# Patient Record
Sex: Male | Born: 1935 | Race: White | Hispanic: No | Marital: Single
Health system: Southern US, Community
[De-identification: ages and names within clinical notes are randomized; demographics above are authoritative.]

## PROBLEM LIST (undated history)

## (undated) DIAGNOSIS — A419 Sepsis, unspecified organism: Secondary | ICD-10-CM

## (undated) DIAGNOSIS — G3183 Dementia with Lewy bodies: Secondary | ICD-10-CM

## (undated) DIAGNOSIS — C649 Malignant neoplasm of unspecified kidney, except renal pelvis: Secondary | ICD-10-CM

## (undated) DIAGNOSIS — R260 Ataxic gait: Secondary | ICD-10-CM

## (undated) DIAGNOSIS — N200 Calculus of kidney: Secondary | ICD-10-CM

## (undated) DIAGNOSIS — G4700 Insomnia, unspecified: Secondary | ICD-10-CM

## (undated) DIAGNOSIS — G20A1 Parkinson's disease without dyskinesia, without mention of fluctuations: Secondary | ICD-10-CM

## (undated) DIAGNOSIS — F32A Depression, unspecified: Secondary | ICD-10-CM

## (undated) DIAGNOSIS — R531 Weakness: Secondary | ICD-10-CM

## (undated) DIAGNOSIS — R1310 Dysphagia, unspecified: Secondary | ICD-10-CM

## (undated) DIAGNOSIS — R269 Unspecified abnormalities of gait and mobility: Secondary | ICD-10-CM

## (undated) DIAGNOSIS — G20 Parkinson's disease: Secondary | ICD-10-CM

## (undated) DIAGNOSIS — F329 Major depressive disorder, single episode, unspecified: Secondary | ICD-10-CM

## (undated) DIAGNOSIS — G309 Alzheimer's disease, unspecified: Secondary | ICD-10-CM

## (undated) DIAGNOSIS — F0280 Dementia in other diseases classified elsewhere without behavioral disturbance: Secondary | ICD-10-CM

---

## 2014-02-19 ENCOUNTER — Emergency Department (HOSPITAL_BASED_OUTPATIENT_CLINIC_OR_DEPARTMENT_OTHER): Payer: Medicare HMO

## 2014-02-19 ENCOUNTER — Emergency Department (HOSPITAL_BASED_OUTPATIENT_CLINIC_OR_DEPARTMENT_OTHER)
Admission: EM | Admit: 2014-02-19 | Discharge: 2014-02-19 | Disposition: A | Discharge status: Home / Self Care | Payer: Medicare HMO | Attending: Emergency Medicine | Admitting: Emergency Medicine

## 2014-02-19 ENCOUNTER — Encounter (HOSPITAL_BASED_OUTPATIENT_CLINIC_OR_DEPARTMENT_OTHER): Payer: Self-pay

## 2014-02-19 DIAGNOSIS — F0280 Dementia in other diseases classified elsewhere without behavioral disturbance: Secondary | ICD-10-CM | POA: Diagnosis not present

## 2014-02-19 DIAGNOSIS — G20 Parkinson's disease: Secondary | ICD-10-CM | POA: Diagnosis not present

## 2014-02-19 DIAGNOSIS — Y998 Other external cause status: Secondary | ICD-10-CM | POA: Diagnosis not present

## 2014-02-19 DIAGNOSIS — Y9389 Activity, other specified: Secondary | ICD-10-CM | POA: Insufficient documentation

## 2014-02-19 DIAGNOSIS — G309 Alzheimer's disease, unspecified: Secondary | ICD-10-CM | POA: Insufficient documentation

## 2014-02-19 DIAGNOSIS — F329 Major depressive disorder, single episode, unspecified: Secondary | ICD-10-CM | POA: Insufficient documentation

## 2014-02-19 DIAGNOSIS — Z23 Encounter for immunization: Secondary | ICD-10-CM | POA: Insufficient documentation

## 2014-02-19 DIAGNOSIS — Y92012 Bathroom of single-family (private) house as the place of occurrence of the external cause: Secondary | ICD-10-CM | POA: Insufficient documentation

## 2014-02-19 DIAGNOSIS — Z85528 Personal history of other malignant neoplasm of kidney: Secondary | ICD-10-CM | POA: Diagnosis not present

## 2014-02-19 DIAGNOSIS — Z87442 Personal history of urinary calculi: Secondary | ICD-10-CM | POA: Diagnosis not present

## 2014-02-19 DIAGNOSIS — Z8619 Personal history of other infectious and parasitic diseases: Secondary | ICD-10-CM | POA: Diagnosis not present

## 2014-02-19 DIAGNOSIS — S50812A Abrasion of left forearm, initial encounter: Secondary | ICD-10-CM | POA: Diagnosis not present

## 2014-02-19 DIAGNOSIS — W19XXXA Unspecified fall, initial encounter: Secondary | ICD-10-CM | POA: Insufficient documentation

## 2014-02-19 DIAGNOSIS — S59912A Unspecified injury of left forearm, initial encounter: Secondary | ICD-10-CM | POA: Diagnosis present

## 2014-02-19 DIAGNOSIS — Z79899 Other long term (current) drug therapy: Secondary | ICD-10-CM | POA: Insufficient documentation

## 2014-02-19 HISTORY — DX: Dementia in other diseases classified elsewhere, unspecified severity, without behavioral disturbance, psychotic disturbance, mood disturbance, and anxiety: F02.80

## 2014-02-19 HISTORY — DX: Unspecified abnormalities of gait and mobility: R26.9

## 2014-02-19 HISTORY — DX: Dysphagia, unspecified: R13.10

## 2014-02-19 HISTORY — DX: Sepsis, unspecified organism: A41.9

## 2014-02-19 HISTORY — DX: Alzheimer's disease, unspecified: G30.9

## 2014-02-19 HISTORY — DX: Ataxic gait: R26.0

## 2014-02-19 HISTORY — DX: Major depressive disorder, single episode, unspecified: F32.9

## 2014-02-19 HISTORY — DX: Calculus of kidney: N20.0

## 2014-02-19 HISTORY — DX: Parkinson's disease: G20

## 2014-02-19 HISTORY — DX: Dementia with Lewy bodies: G31.83

## 2014-02-19 HISTORY — DX: Malignant neoplasm of unspecified kidney, except renal pelvis: C64.9

## 2014-02-19 HISTORY — DX: Insomnia, unspecified: G47.00

## 2014-02-19 HISTORY — DX: Depression, unspecified: F32.A

## 2014-02-19 HISTORY — DX: Parkinson's disease without dyskinesia, without mention of fluctuations: G20.A1

## 2014-02-19 HISTORY — DX: Weakness: R53.1

## 2014-02-19 LAB — URINALYSIS, ROUTINE W REFLEX MICROSCOPIC
Bilirubin Urine: NEGATIVE
Glucose, UA: NEGATIVE mg/dL
Hgb urine dipstick: NEGATIVE
KETONES UR: 15 mg/dL — AB
LEUKOCYTES UA: NEGATIVE
NITRITE: NEGATIVE
PROTEIN: NEGATIVE mg/dL
Specific Gravity, Urine: 1.022 (ref 1.005–1.030)
UROBILINOGEN UA: 0.2 mg/dL (ref 0.0–1.0)
pH: 6.5 (ref 5.0–8.0)

## 2014-02-19 MED ORDER — TETANUS-DIPHTH-ACELL PERTUSSIS 5-2.5-18.5 LF-MCG/0.5 IM SUSP
0.5000 mL | Freq: Once | INTRAMUSCULAR | Status: AC
  Administered 2014-02-19: 0.5 mL via INTRAMUSCULAR
  Filled 2014-02-19: qty 0.5

## 2014-02-19 MED ORDER — ACETAMINOPHEN 325 MG PO TABS
650.0000 mg | ORAL_TABLET | Freq: Once | ORAL | Status: AC
  Administered 2014-02-19: 650 mg via ORAL
  Filled 2014-02-19: qty 2

## 2014-02-19 NOTE — ED Notes (Signed)
[facility] here for transport. 

## 2014-02-19 NOTE — ED Notes (Signed)
Per ems patient had unwitnessed fall in bathroom at SN[facility]).  Complains of thumb pain and back pain

## 2014-02-19 NOTE — Discharge Instructions (Signed)
The patient was evaluated in the emergency department. We got a CT of his head and an x-ray of his left hand. EMS reported that he had been complaining of left thumb pain. There was a questionable lucency on the x-ray but on my repeat examination he had no pain in the thumb so it was not splinted. The lucency is thought to be a vascular channel and less likely that it is actually a fracture. If he does develop pain in the left thumb it would not be unreasonable to buddy tape the thumb or splint it. The CT of his head was unremarkable and his urinalysis was unremarkable as well.

## 2014-02-19 NOTE — ED Provider Notes (Signed)
CSN: [id number]     Arrival date & time 02/19/14  0945 History   First MD Initiated Contact with Patient 02/19/14 [phone]     Chief Complaint  Patient presents with  . Fall     (Consider location/radiation/quality/duration/timing/severity/associated sxs/prior Treatment) Patient is a 80 y.o. male presenting with fall. The history is provided by the patient.  Fall This is a new problem. The current episode started 1 to 2 hours ago. Episode frequency: once. The problem has been resolved. Pertinent negatives include no chest pain, no abdominal pain, no headaches and no shortness of breath. Nothing aggravates the symptoms. Nothing relieves the symptoms. He has tried nothing for the symptoms. The treatment provided significant relief.    Past Medical History  Diagnosis Date  . Weakness   . Sepsis   . Insomnia   . Alzheimer disease   . Parkinson disease   . Dementia with Lewy bodies   . Abnormality of gait and mobility   . Kidney malignant neoplasm   . Kidney stone   . Depressive disorder   . Dysphagia   . Ataxic gait    History reviewed. No pertinent past surgical history. No family history on file. History  Substance Use Topics  . Smoking status: Unknown If Ever Smoked  . Smokeless tobacco: Not on file  . Alcohol Use: No    Review of Systems  Constitutional: Negative for fever.  HENT: Negative for drooling and rhinorrhea.   Eyes: Negative for pain.  Respiratory: Negative for cough and shortness of breath.   Cardiovascular: Negative for chest pain and leg swelling.  Gastrointestinal: Negative for nausea, vomiting, abdominal pain and diarrhea.  Genitourinary: Negative for dysuria and hematuria.  Musculoskeletal: Negative for gait problem and neck pain.  Skin: Negative for color change.  Neurological: Negative for numbness and headaches.  Hematological: Negative for adenopathy.  Psychiatric/Behavioral: Negative for behavioral problems.  All other systems reviewed and are  negative.     Allergies  Review of patient's allergies indicates no known allergies.  Home Medications   Prior to Admission medications   Medication Sig Start Date End Date Taking? Authorizing Provider  carbidopa-levodopa (SINEMET IR) 25-250 MG per tablet Take 1 tablet by mouth 3 (three) times daily.   Yes Historical Provider, MD  clonazePAM (KLONOPIN) 2 MG tablet Take 2 mg by mouth at bedtime.   Yes Historical Provider, MD  cycloSPORINE (RESTASIS) 0.05 % ophthalmic emulsion 1 drop 2 (two) times daily.   Yes Historical Provider, MD  divalproex (DEPAKOTE) 125 MG DR tablet Take 125 mg by mouth 2 (two) times daily.   Yes Historical Provider, MD  Memantine HCl ER (NAMENDA XR) 28 MG CP24 Take 28 mg by mouth daily.   Yes Historical Provider, MD  multivitamin with minerals (CERTA-VITE) LIQD Take 5 mLs by mouth daily.   Yes Historical Provider, MD  polyethylene glycol (MIRALAX / GLYCOLAX) packet Take 17 g by mouth daily.   Yes Historical Provider, MD  sertraline (ZOLOFT) 50 MG tablet Take 50 mg by mouth daily.   Yes Historical Provider, MD  tamsulosin (FLOMAX) 0.4 MG CAPS capsule Take 0.4 mg by mouth.   Yes Historical Provider, MD   BP 101/48 mmHg  Pulse 62  Temp(Src) 98.3 F (36.8 C) (Oral)  Resp 18  Ht 6' (1.829 m)  Wt 180 lb (81.647 kg)  BMI 24.41 kg/m2  SpO2 97% Physical Exam  Constitutional: He appears well-developed and well-nourished.  HENT:  Head: Normocephalic and atraumatic.  Right Ear: External  ear normal.  Left Ear: External ear normal.  Nose: Nose normal.  Mouth/Throat: Oropharynx is clear and moist. No oropharyngeal exudate.  Eyes: Conjunctivae and EOM are normal. Pupils are equal, round, and reactive to light.  Neck: Normal range of motion. Neck supple.  No vertebral tenderness noted.  Cardiovascular: Normal rate, regular rhythm, normal heart sounds and intact distal pulses.  Exam reveals no gallop and no friction rub.   No murmur heard. Pulmonary/Chest: Effort  normal and breath sounds normal. No respiratory distress. He has no wheezes.  Abdominal: Soft. Bowel sounds are normal. He exhibits no distension. There is no tenderness. There is no rebound and no guarding.  Musculoskeletal: Normal range of motion. He exhibits tenderness. He exhibits no edema.  Normal strength and sensation in all extremities.  Normal motor skills of the hands. No obvious injury or focal ttp of the hands.   Normal range of motion of the hips bilaterally without pain.  Mild abrasion to the dorsal aspect of the left forearm.  Neurological: He is alert.  A/o x 2. Thought the year was 2014.   Skin: Skin is warm and dry.  Psychiatric: He has a normal mood and affect. His behavior is normal.  Nursing note and vitals reviewed.   ED Course  Procedures (including critical care time) Labs Review Labs Reviewed  URINALYSIS, ROUTINE W REFLEX MICROSCOPIC - Abnormal; Notable for the following:    Ketones, ur 15 (*)    All other components within normal limits    Imaging Review Ct Head Wo Contrast  02/19/2014   CLINICAL DATA:  Unwitnessed fall in nursing home.  EXAM: CT HEAD WITHOUT CONTRAST  TECHNIQUE: Contiguous axial images were obtained from the base of the skull through the vertex without intravenous contrast.  COMPARISON:  Head CT 12/12/2013  FINDINGS: Stable atrophy, ventriculomegaly, and periventricular white matter hypodensity. No intracranial hemorrhage, mass effect, or midline shift. No evidence of territorial infarct. No intracranial fluid collection. Calvarium is intact. Included paranasal sinuses and mastoid air cells are well aerated.  IMPRESSION: Stable chronic change, no acute intracranial abnormality. No fracture.   Electronically Signed   By: Jeb Levering M.D.   On: 02/19/2014 10:28   Dg Hand Complete Left  02/19/2014   CLINICAL DATA:  80 year old male with left thumb pain status post unwitnessed fall  EXAM: LEFT HAND - COMPLETE 3+ VIEW  COMPARISON:  None.   FINDINGS: Small linear lucency through the radial aspect of the base of the proximal phalanx of the thumb a visible only on the lateral view. There is a suggestion of ossification of the margins of the lucency. The bones appear osteopenic. Mild degenerative osteoarthritis involving the DIP joints and thumb CMC joint. No focal soft tissue abnormality.  IMPRESSION: 1. Possible nondisplaced fracture through the radial aspect of the base of the proximal phalanx of the thumb. If there is no point tenderness at this location, this may simply represent a prominent vascular channel. 2. 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   Electronically Signed   By: Jacqulynn Cadet M.D.   On: 02/19/2014 10:38     EKG Interpretation None      MDM   Final diagnoses:  Fall    10:05 AM 80 y.o. male w hx of parkinsons, alzheimers, ataxic gait who presents with an unwitnessed fall at his facility. The patient states that he was leaving the bathroom with his walker when the walker ran into the rubber bumper at the  door. This caused him to fall forwards into the side. He believes he hit his head on the ground. He denies loss of consciousness. He complains of a mild headache and right thumb pain. He is alert and oriented 2. He has no other complaints on exam and describes a good story for a mechanical fall. We'll get screening imaging and urinalysis.  11:26 AM:UA unremarkable. questionable lucency noted on plain film. I reviewed the imaging and reexamined the patient. He has absolutely no tenderness to palpation of his left thumb. I do not think he needs to be in a splint or buddy taped. We did let the facility know about this and if he does develop pain in the left thumb I recommended splint versus buddy tape. Pt able to stand and bare weight here. I have discussed the diagnosis/risks/treatment options with the patient and facility and believe the pt to be eligible for discharge home to  follow-up with his pcp as needed. We also discussed returning to the ED immediately if new or worsening sx occur. We discussed the sx which are most concerning (e.g., worsening pain) that necessitate immediate return. Medications administered to the patient during their visit and any new prescriptions provided to the patient are listed below.  Medications given during this visit Medications  acetaminophen (TYLENOL) tablet 650 mg (650 mg Oral Given 02/19/14 1040)  Tdap (BOOSTRIX) injection 0.5 mL (0.5 mLs Intramuscular Given 02/19/14 1030)    New Prescriptions   No medications on file       Pamella Pert, MD 02/19/14 1128

## 2014-03-26 ENCOUNTER — Encounter (HOSPITAL_BASED_OUTPATIENT_CLINIC_OR_DEPARTMENT_OTHER): Payer: Self-pay | Admitting: *Deleted

## 2014-03-26 ENCOUNTER — Emergency Department (HOSPITAL_BASED_OUTPATIENT_CLINIC_OR_DEPARTMENT_OTHER)
Admission: EM | Admit: 2014-03-26 | Discharge: 2014-03-27 | Disposition: A | Discharge status: Home / Self Care | Payer: Medicare HMO | Attending: Emergency Medicine | Admitting: Emergency Medicine

## 2014-03-26 DIAGNOSIS — Y999 Unspecified external cause status: Secondary | ICD-10-CM | POA: Diagnosis not present

## 2014-03-26 DIAGNOSIS — Z85528 Personal history of other malignant neoplasm of kidney: Secondary | ICD-10-CM | POA: Diagnosis not present

## 2014-03-26 DIAGNOSIS — Y9389 Activity, other specified: Secondary | ICD-10-CM | POA: Diagnosis not present

## 2014-03-26 DIAGNOSIS — S80212A Abrasion, left knee, initial encounter: Secondary | ICD-10-CM | POA: Diagnosis not present

## 2014-03-26 DIAGNOSIS — W1839XA Other fall on same level, initial encounter: Secondary | ICD-10-CM | POA: Insufficient documentation

## 2014-03-26 DIAGNOSIS — F0280 Dementia in other diseases classified elsewhere without behavioral disturbance: Secondary | ICD-10-CM | POA: Diagnosis not present

## 2014-03-26 DIAGNOSIS — Z79899 Other long term (current) drug therapy: Secondary | ICD-10-CM | POA: Insufficient documentation

## 2014-03-26 DIAGNOSIS — Y9289 Other specified places as the place of occurrence of the external cause: Secondary | ICD-10-CM | POA: Insufficient documentation

## 2014-03-26 DIAGNOSIS — G309 Alzheimer's disease, unspecified: Secondary | ICD-10-CM | POA: Insufficient documentation

## 2014-03-26 DIAGNOSIS — Z87442 Personal history of urinary calculi: Secondary | ICD-10-CM | POA: Insufficient documentation

## 2014-03-26 DIAGNOSIS — F0390 Unspecified dementia without behavioral disturbance: Secondary | ICD-10-CM

## 2014-03-26 DIAGNOSIS — S8992XA Unspecified injury of left lower leg, initial encounter: Secondary | ICD-10-CM | POA: Diagnosis present

## 2014-03-26 DIAGNOSIS — W19XXXA Unspecified fall, initial encounter: Secondary | ICD-10-CM

## 2014-03-26 NOTE — ED Notes (Signed)
Pt.  Is from [place] [facility]  Pt. [place] tonight unwitnessed fall with  No complaints.   Pt. Is alert and oriented to his normal self per the Staff and is in no distress.

## 2014-03-26 NOTE — ED Provider Notes (Signed)
CSN: [id number]     Arrival date & time 03/26/14  1932    Chief Complaint  Patient presents with  . Fall   The history is provided by the patient. No language interpreter was used.    HPI Comments: Pedro Sherman is a 81 y.o. male who presents to the Emergency Department after an unwitnessed fall at [facility]. The patient was found on the floor. There is no description no specific injury or mechanism of fall. I have contacted the patient's wife to get additional information. She reports the patient has very frequent falls due to his dementia. She reports is baseline mental status is significantly confused. He has recently within the past month becoming a resident at a nursing home due to her inability to care for him in their home. The patient does not report any pain. He did describe a fall however he described falling on ice which was not possible. He however did not have any acute pain complaints.  Past Medical History  Diagnosis Date  . Weakness   . Sepsis   . Insomnia   . Alzheimer disease   . Parkinson disease   . Dementia with Lewy bodies   . Abnormality of gait and mobility   . Kidney malignant neoplasm   . Kidney stone   . Depressive disorder   . Dysphagia   . Ataxic gait    History reviewed. No pertinent past surgical history. No family history on file. History  Substance Use Topics  . Smoking status: Unknown If Ever Smoked  . Smokeless tobacco: Not on file  . Alcohol Use: No    Review of Systems The patient cannot provide review of systems. Per his wife there was no recent developing acute illness reported are known.   Allergies  Review of patient's allergies indicates no known allergies.  Home Medications   Prior to Admission medications   Medication Sig Start Date End Date Taking? Authorizing Provider  carbidopa-levodopa (SINEMET IR) 25-250 MG per tablet Take 1 tablet by mouth 3 (three) times daily.    Historical Provider, MD  clonazePAM  (KLONOPIN) 2 MG tablet Take 2 mg by mouth at bedtime.    Historical Provider, MD  cycloSPORINE (RESTASIS) 0.05 % ophthalmic emulsion 1 drop 2 (two) times daily.    Historical Provider, MD  divalproex (DEPAKOTE) 125 MG DR tablet Take 125 mg by mouth 2 (two) times daily.    Historical Provider, MD  Memantine HCl ER (NAMENDA XR) 28 MG CP24 Take 28 mg by mouth daily.    Historical Provider, MD  multivitamin with minerals (CERTA-VITE) LIQD Take 5 mLs by mouth daily.    Historical Provider, MD  polyethylene glycol (MIRALAX / GLYCOLAX) packet Take 17 g by mouth daily.    Historical Provider, MD  sertraline (ZOLOFT) 50 MG tablet Take 50 mg by mouth daily.    Historical Provider, MD  tamsulosin (FLOMAX) 0.4 MG CAPS capsule Take 0.4 mg by mouth.    Historical Provider, MD   BP 125/57 mmHg  Pulse 62  Temp(Src) 98.6 F (37 C) (Oral)  Resp 16  SpO2 98% Physical Exam  Constitutional: He appears well-developed and well-nourished.  The patient is alert but confused. He has no respiratory distress and his color is good.  HENT:  Head: Normocephalic and atraumatic.  Right Ear: External ear normal.  Left Ear: External ear normal.  Nose: Nose normal.  Mouth/Throat: Oropharynx is clear and moist.  Eyes: EOM are normal. Pupils are equal, round,  and reactive to light.  Neck: Neck supple.  Cardiovascular: Normal rate, regular rhythm, normal heart sounds and intact distal pulses.   Pulmonary/Chest: Effort normal and breath sounds normal.  Abdominal: Soft. Bowel sounds are normal. He exhibits no distension. There is no tenderness.  Musculoskeletal: Normal range of motion. He exhibits no edema.  The patient has a 2 cm abrasion to the left knee. There is no active bleeding. There is no associated effusion or knee deformity. Patient has full range of motion of all 4 extremities without pain.  Neurological: He is alert. He has normal strength. No cranial nerve deficit. Coordination normal. GCS eye subscore is 4. GCS  verbal subscore is 5. GCS motor subscore is 6.  The patient is alert and interactive. He is however very confused.  Skin: Skin is warm, dry and intact.  Psychiatric: He has a normal mood and affect.    ED Course  Procedures (including critical care time) DIAGNOSTIC STUDIES: Oxygen Saturation is 98% on RA, normal by my interpretation.    COORDINATION OF CARE: 11:29 PM Discussed treatment plan with pt at bedside and pt agreed to plan.   Labs Review Labs Reviewed - No data to display  Imaging Review No results found.   EKG Interpretation None      MDM   Final diagnoses:  Fall, initial encounter  Knee abrasion, left, initial encounter  Dementia, without behavioral disturbance   The patient has known recurrent falls and severe dementia. At this point he is pleasant and alert. He has been ambulatory with assistance. There is no evidence on physical examination of acute extremity, thoracic or pelvic injury. There is no reported head injury and at this time with the patient's mental status appearing to be at baseline I did not feel that repeat CT scan was indicated. The patient's wife reports multiple falls due to the patient's advanced dementia. Instructions are to watch for changes from the patient's baseline or other evidence of injury.  Charlesetta Shanks, MD 03/26/14 2330

## 2014-03-26 NOTE — Discharge Instructions (Signed)
Abrasion An abrasion is a cut or scrape of the skin. Abrasions do not extend through all layers of the skin and most heal within 10 days. It is important to care for your abrasion properly to prevent infection. CAUSES  Most abrasions are caused by falling on, or gliding across, the ground or other surface. When your skin rubs on something, the outer and inner layer of skin rubs off, causing an abrasion. DIAGNOSIS  Your caregiver will be able to diagnose an abrasion during a physical exam.  TREATMENT  Your treatment depends on how large and deep the abrasion is. Generally, your abrasion will be cleaned with water and a mild soap to remove any dirt or debris. An antibiotic ointment may be put over the abrasion to prevent an infection. A bandage (dressing) may be wrapped around the abrasion to keep it from getting dirty.  You may need a tetanus shot if:  You cannot remember when you had your last tetanus shot.  You have never had a tetanus shot.  The injury broke your skin. If you get a tetanus shot, your arm may swell, get red, and feel warm to the touch. This is common and not a problem. If you need a tetanus shot and you choose not to have one, there is a rare chance of getting tetanus. Sickness from tetanus can be serious.  HOME CARE INSTRUCTIONS   If a dressing was applied, change it at least once a day or as directed by your caregiver. If the bandage sticks, soak it off with warm water.   Wash the area with water and a mild soap to remove all the ointment 2 times a day. Rinse off the soap and pat the area dry with a clean towel.   Reapply any ointment as directed by your caregiver. This will help prevent infection and keep the bandage from sticking. Use gauze over the wound and under the dressing to help keep the bandage from sticking.   Change your dressing right away if it becomes wet or dirty.   Only take over-the-counter or prescription medicines for pain, discomfort, or fever as  directed by your caregiver.   Follow up with your caregiver within 24-48 hours for a wound check, or as directed. If you were not given a wound-check appointment, look closely at your abrasion for redness, swelling, or pus. These are signs of infection. SEEK IMMEDIATE MEDICAL CARE IF:   You have increasing pain in the wound.   You have redness, swelling, or tenderness around the wound.   You have pus coming from the wound.   You have a fever or persistent symptoms for more than 2-3 days.  You have a fever and your symptoms suddenly get worse.  You have a bad smell coming from the wound or dressing.  MAKE SURE YOU:   Understand these instructions.  Will watch your condition.  Will get help right away if you are not doing well or get worse. Document Released: 11/19/2004 Document Revised: 01/27/2012 Document Reviewed: 01/13/2011 [facility] Patient Information 2015 [place], [place]. This information is not intended to replace advice given to you by your health care provider. Make sure you discuss any questions you have with your health care provider.  Possible Head Injury (follow precautions) You have received a head injury. It does not appear serious at this time. Headaches and vomiting are common following head injury. It should be easy to awaken from sleeping. Sometimes it is necessary for you to stay in the emergency department for a while for observation. Sometimes admission to the hospital may be needed. After injuries such as yours, most problems occur within the first 24 hours, but side effects may occur up to 7-10 days after the injury. It is important for you to carefully monitor your condition and contact your health care provider or seek immediate medical care if there is a change in your condition. WHAT ARE THE TYPES OF HEAD INJURIES? Head injuries  can be as minor as a bump. Some head injuries can be more severe. More severe head injuries include: A jarring injury to the brain (concussion). A bruise of the brain (contusion). This mean there is bleeding in the brain that can cause swelling. A cracked skull (skull fracture). Bleeding in the brain that collects, clots, and forms a bump (hematoma). WHAT CAUSES A HEAD INJURY? A serious head injury is most likely to happen to someone who is in a car wreck and is not wearing a seat belt. Other causes of major head injuries include bicycle or motorcycle accidents, sports injuries, and falls. HOW ARE HEAD INJURIES DIAGNOSED? A complete history of the event leading to the injury and your current symptoms will be helpful in diagnosing head injuries. Many times, pictures of the brain, such as CT or MRI are needed to see the extent of the injury. Often, an overnight hospital stay is necessary for observation.  WHEN SHOULD I SEEK IMMEDIATE MEDICAL CARE?  You should get help right away if: You have confusion or drowsiness. You feel sick to your stomach (nauseous) or have continued, forceful vomiting. You have dizziness or unsteadiness that is getting worse. You have severe, continued headaches not relieved by medicine. Only take over-the-counter or prescription medicines for pain, fever, or discomfort as directed by your health care provider. You do not have normal function of the arms or legs or are unable to walk. You notice changes in the black spots in the center of the colored part of your eye (pupil). You have a clear or bloody fluid coming from your nose or ears. You have a loss of vision. During the next 24 hours after the injury, you must stay with someone who can watch you for the warning signs. This person should contact local emergency services (911 in the [place] if you have seizures, you become unconscious, or you are unable to wake up. HOW CAN I PREVENT A HEAD INJURY IN THE FUTURE? The most  important factor for preventing major head injuries is avoiding motor vehicle accidents. To minimize the potential for damage to your head, it is crucial to wear seat belts while riding in motor vehicles. Wearing helmets while bike riding and playing collision sports (like football) is also helpful. Also, avoiding dangerous activities around the house will further help reduce your risk of head injury.  WHEN CAN I RETURN TO NORMAL ACTIVITIES AND ATHLETICS? You should be reevaluated by your health care provider before returning to these activities. If you have any of the following symptoms, you should not return to activities or contact sports until 1 week after the symptoms have stopped: Persistent headache. Dizziness or vertigo. Poor attention and concentration. Confusion. Memory problems. Nausea or vomiting. Fatigue or tire easily. Irritability. Intolerant of bright  lights or loud noises. Anxiety or depression. Disturbed sleep. MAKE SURE YOU:  Understand these instructions. Will watch your condition. Will get help right away if you are not doing well or get worse. Document Released: 02/09/2005 Document Revised: 02/14/2013 Document Reviewed: 10/17/2012 [facility] Patient Information 2015 [place], [place]. This information is not intended to replace advice given to you by your health care provider. Make sure you discuss any questions you have with your health care provider.

## 2014-03-26 NOTE — ED Notes (Signed)
Blood sugar 112

## 2015-05-31 IMAGING — CT CT HEAD W/O CM
1 series · 16 of 30 positions shown, 20 images · non-contrast
Comparison: Head CT 12/12/2013

CLINICAL DATA: Unwitnessed fall in [HOSPITAL].

EXAM:
CT HEAD WITHOUT CONTRAST
TECHNIQUE: Contiguous axial images were obtained from the base of the skull
through the vertex without intravenous contrast.

[Series 2: head 4.8 h37s · axial · 0.47mm/px · z∈[+155,+295]mm · 16 of 32 slices shown, 20 images]
[im 2/32  brain]
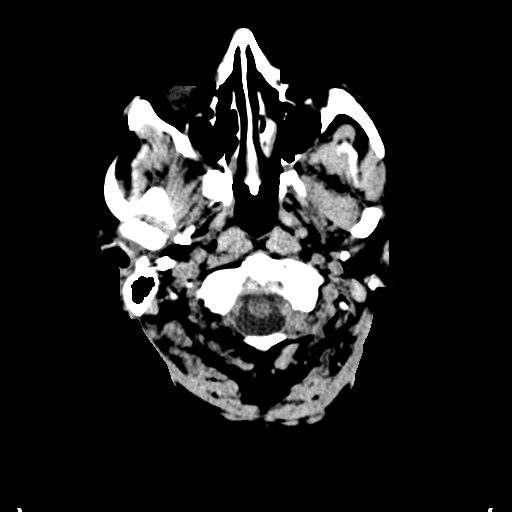
[im 2/32  bone]
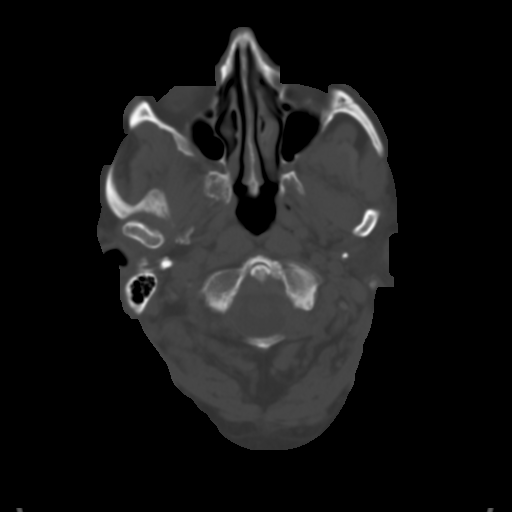
[im 4/32  brain]
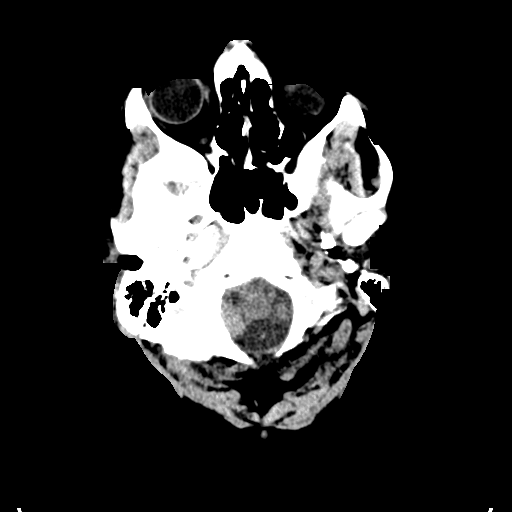
[im 6/32  brain]
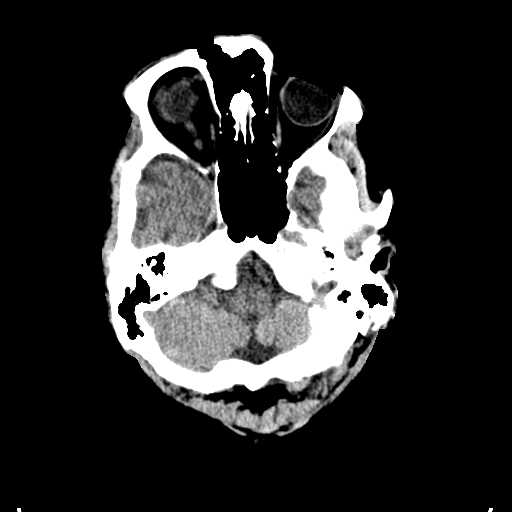
[im 8/32  brain]
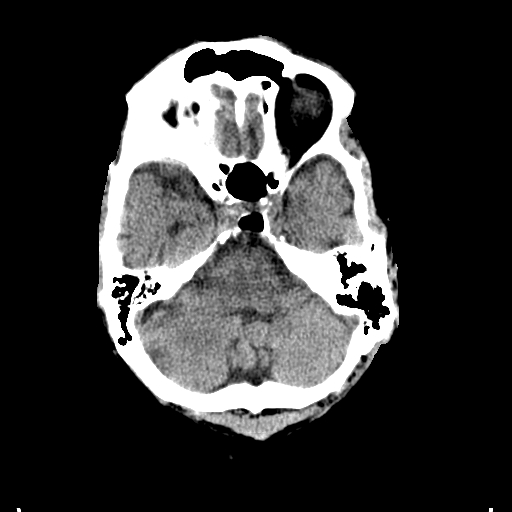
[im 9/32  brain]
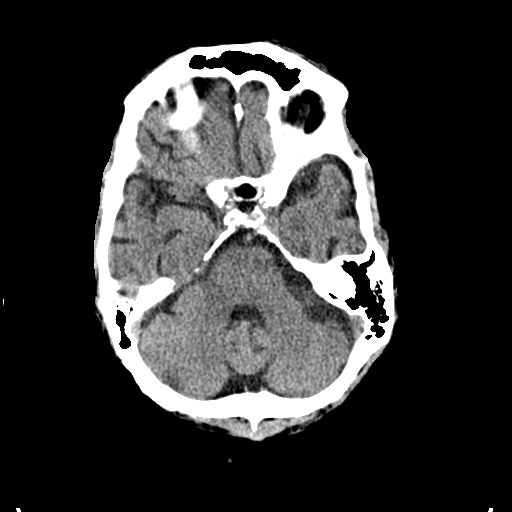
[im 9/32  bone]
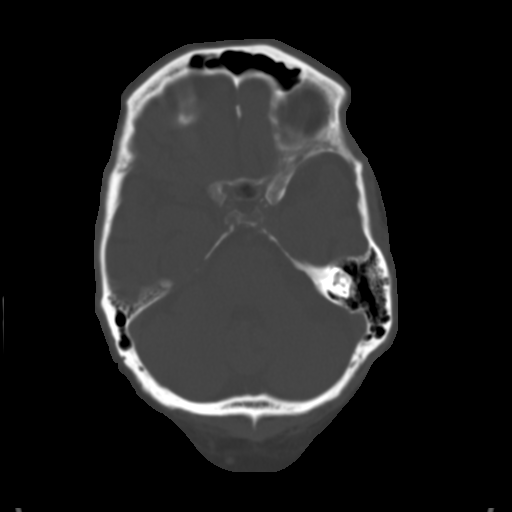
[im 11/32  brain]
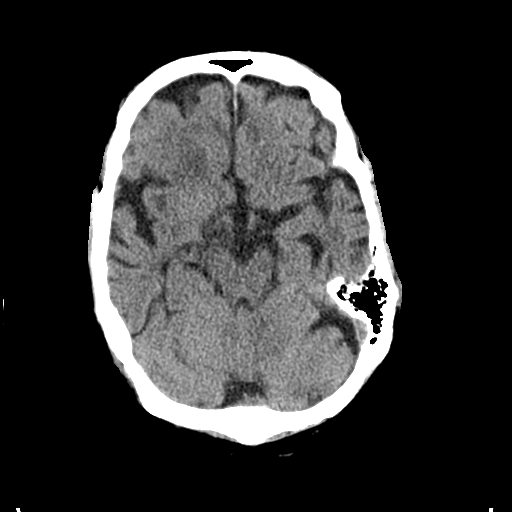
[im 13/32  brain]
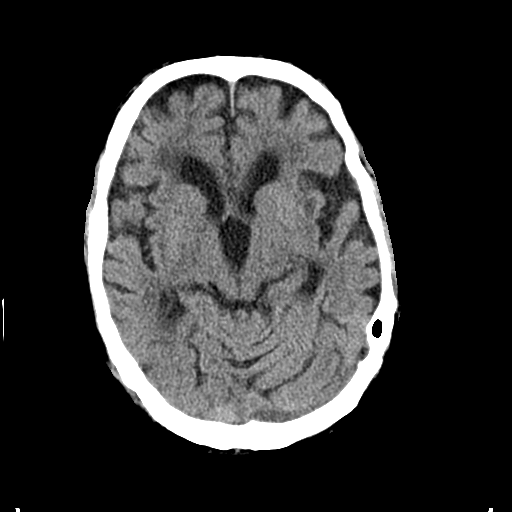
[im 15/32  brain]
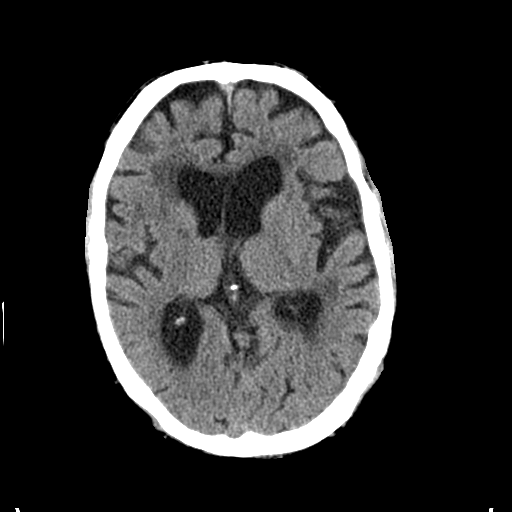
[im 17/32  brain]
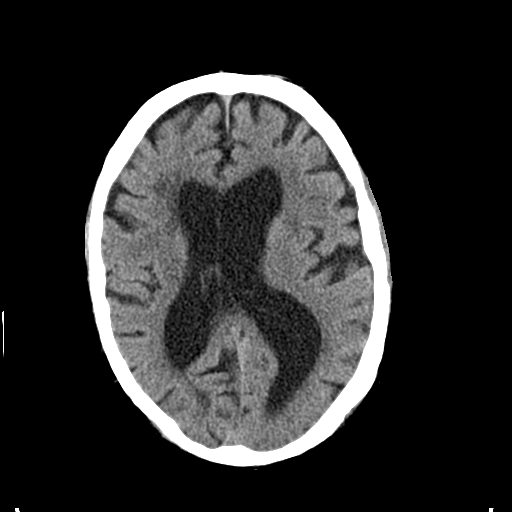
[im 17/32  bone]
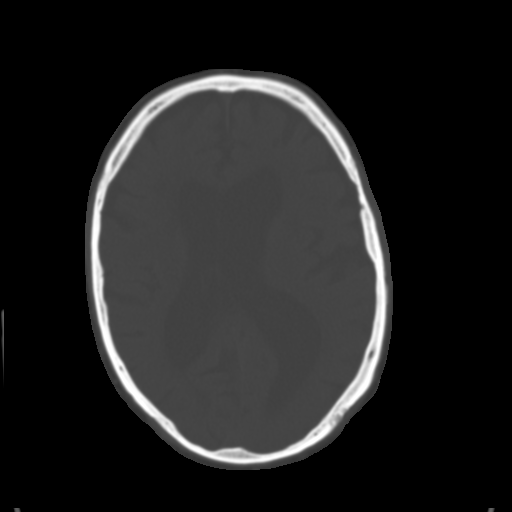
[im 19/32  brain]
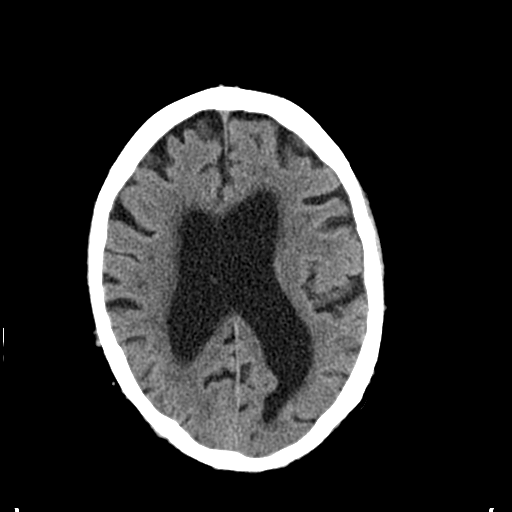
[im 21/32  brain]
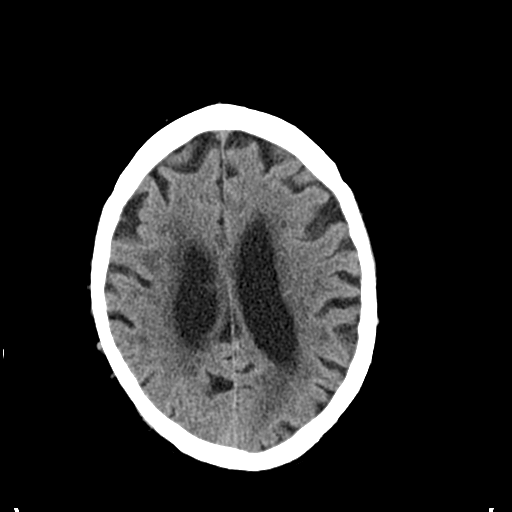
[im 23/32  brain]
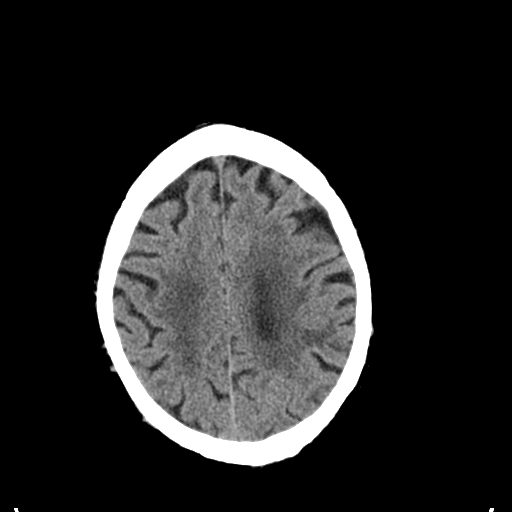
[im 24/32  brain]
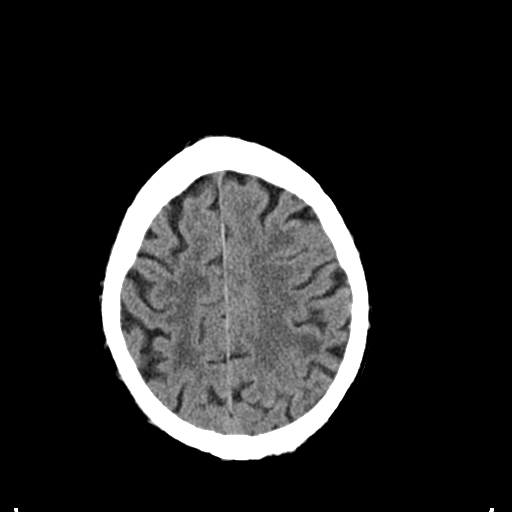
[im 24/32  bone]
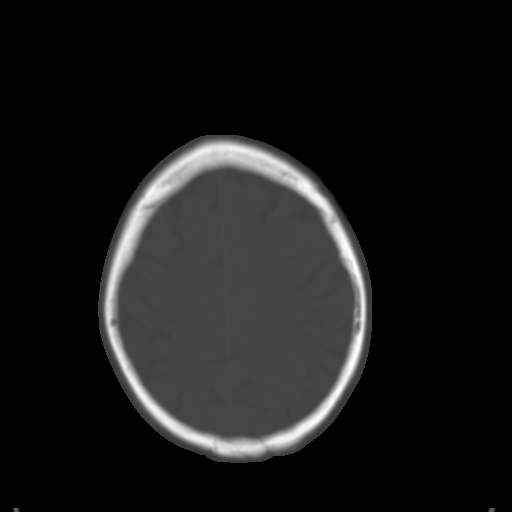
[im 26/32  brain]
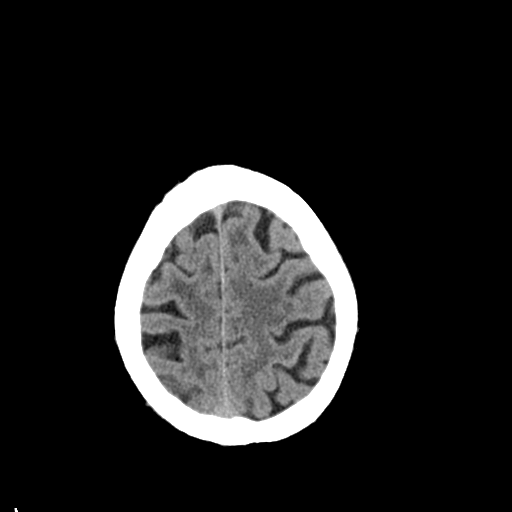
[im 28/32  brain]
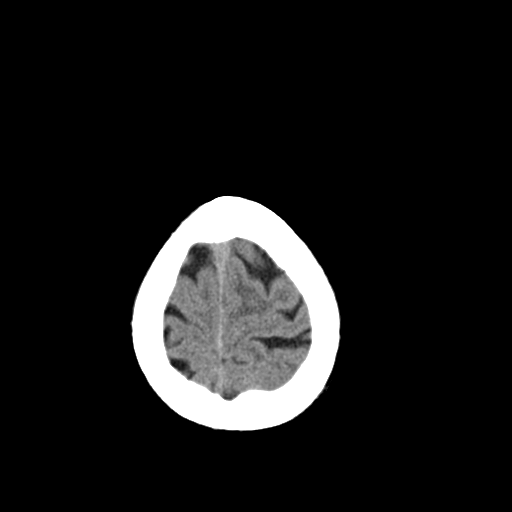
[im 30/32  brain]
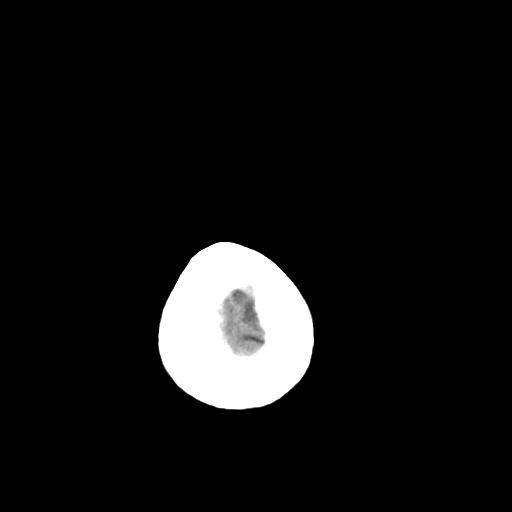

[16 of 30 positions shown; findings below may reference images not displayed]

FINDINGS: Stable atrophy, ventriculomegaly, and periventricular white matter
hypodensity. No intracranial hemorrhage, mass effect, or midline
shift. No evidence of territorial infarct. No intracranial fluid
collection. Calvarium is intact. Included paranasal sinuses and
mastoid air cells are well aerated.
IMPRESSION: Stable chronic change, no acute intracranial abnormality. No
fracture.

## 2015-05-31 IMAGING — CR DG HAND COMPLETE 3+V*L*
3 series · 3 of 3 positions shown · non-contrast
Comparison: None.

CLINICAL DATA: 80-year-old male with left thumb pain status post
unwitnessed fall

EXAM:
LEFT HAND - COMPLETE 3+ VIEW

[x hand pa left]
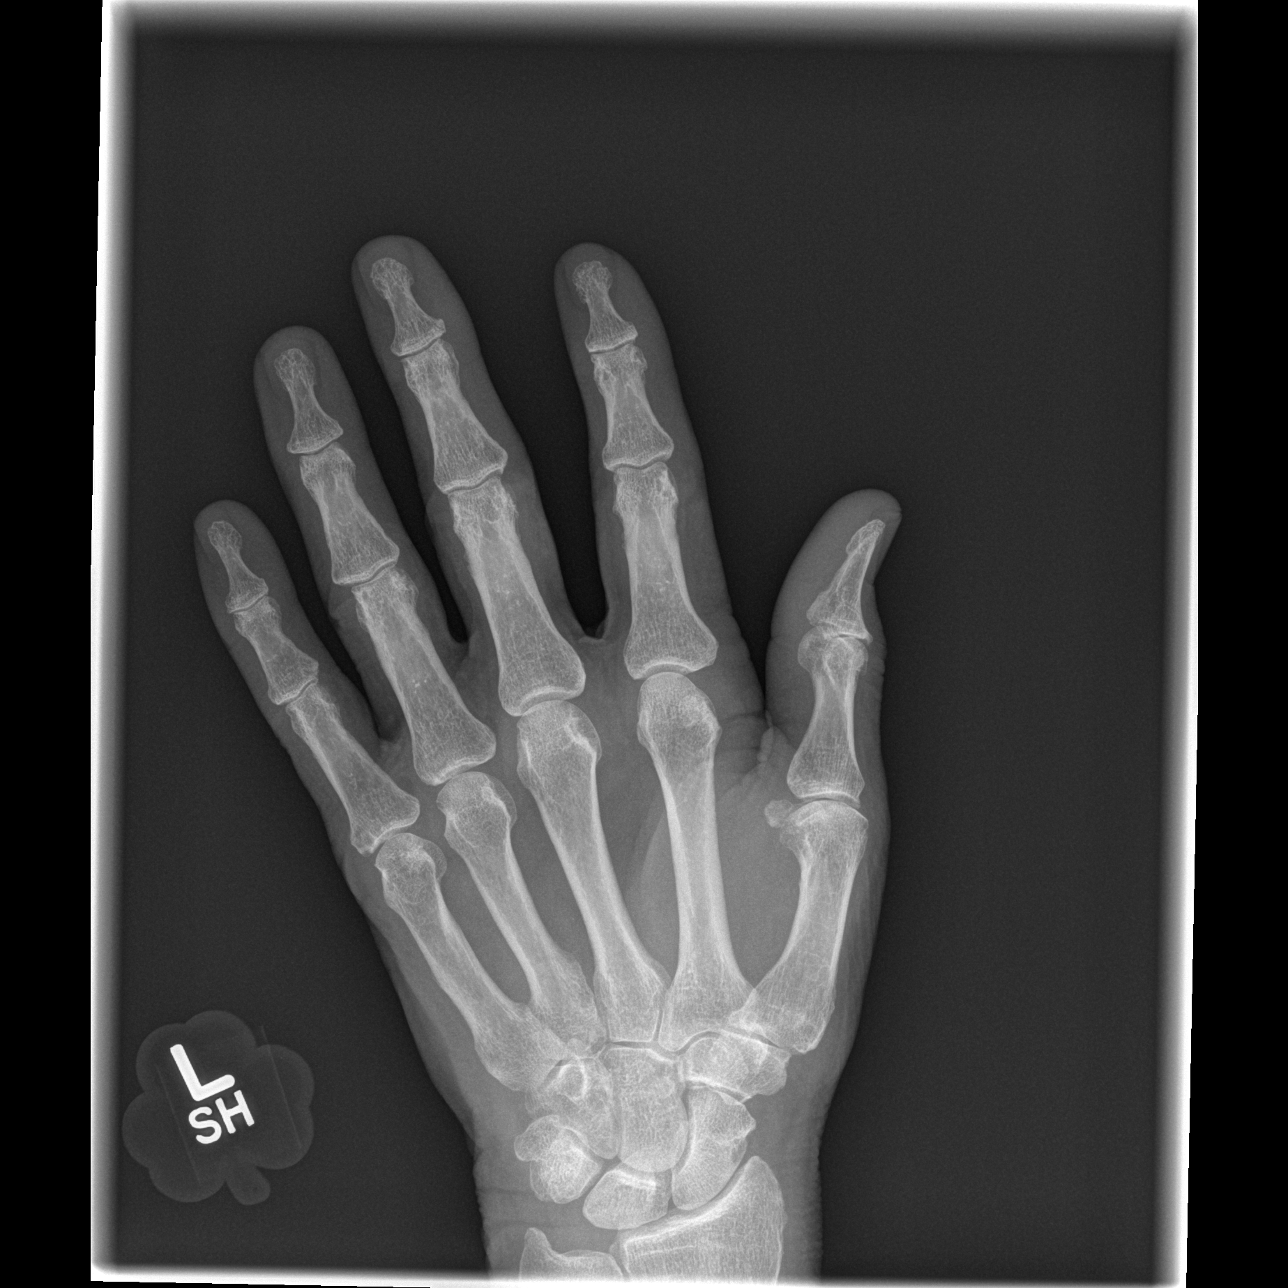

[x hand oblique left]
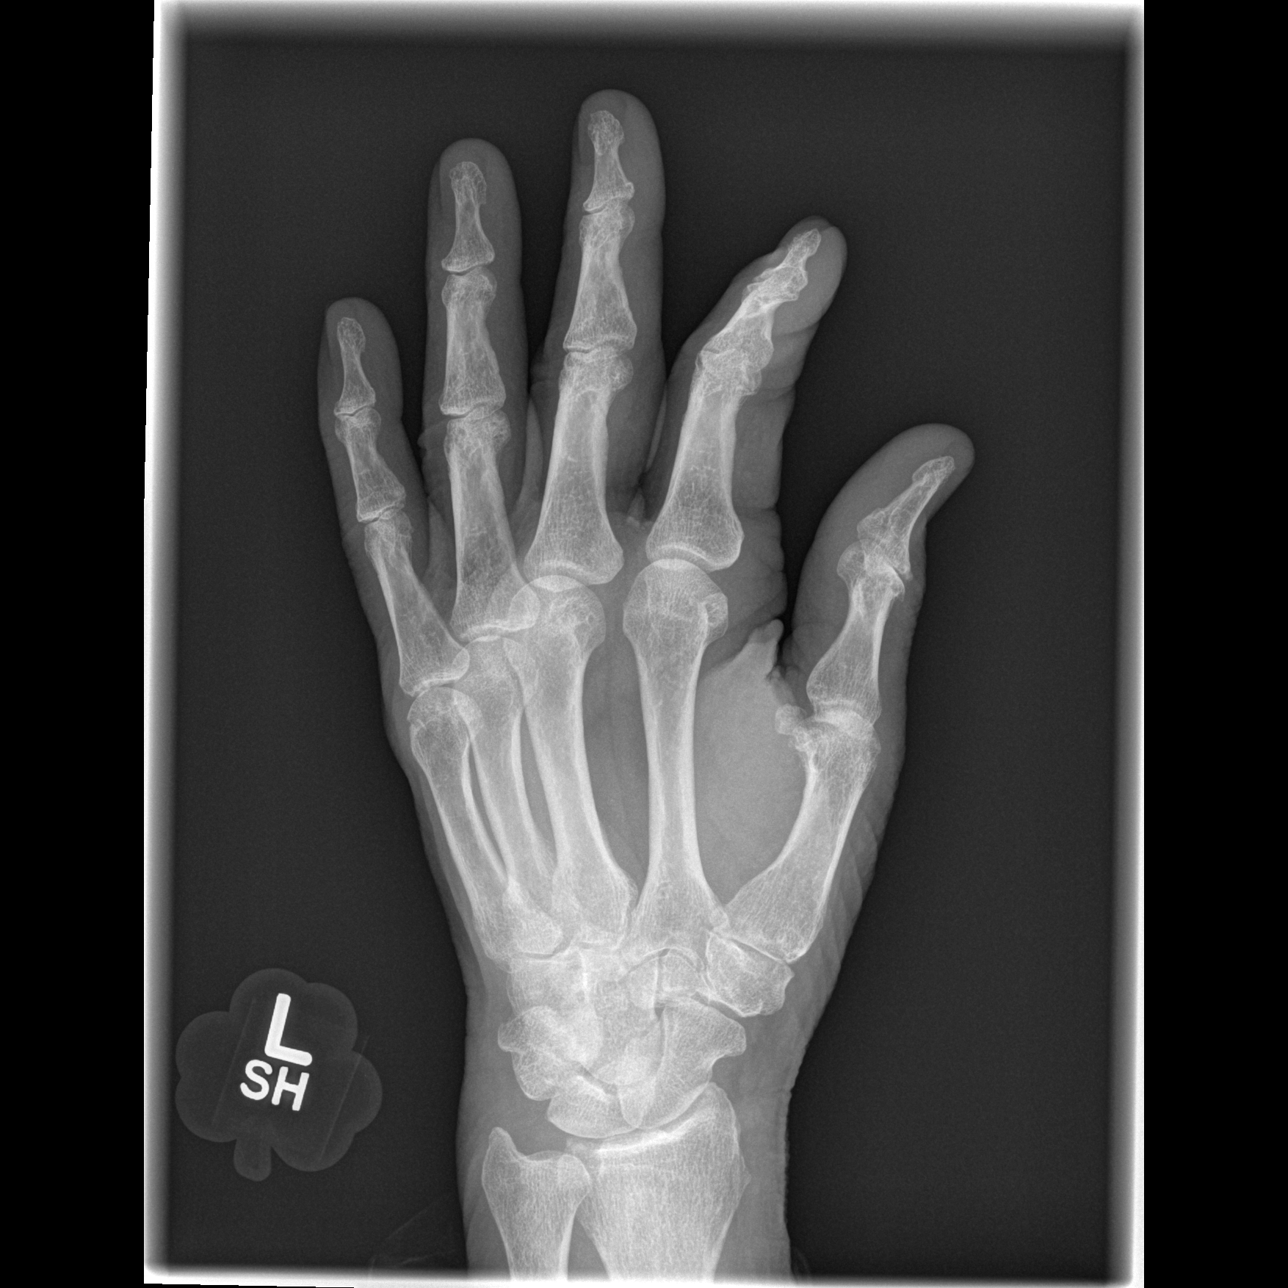

[x hand lat left]
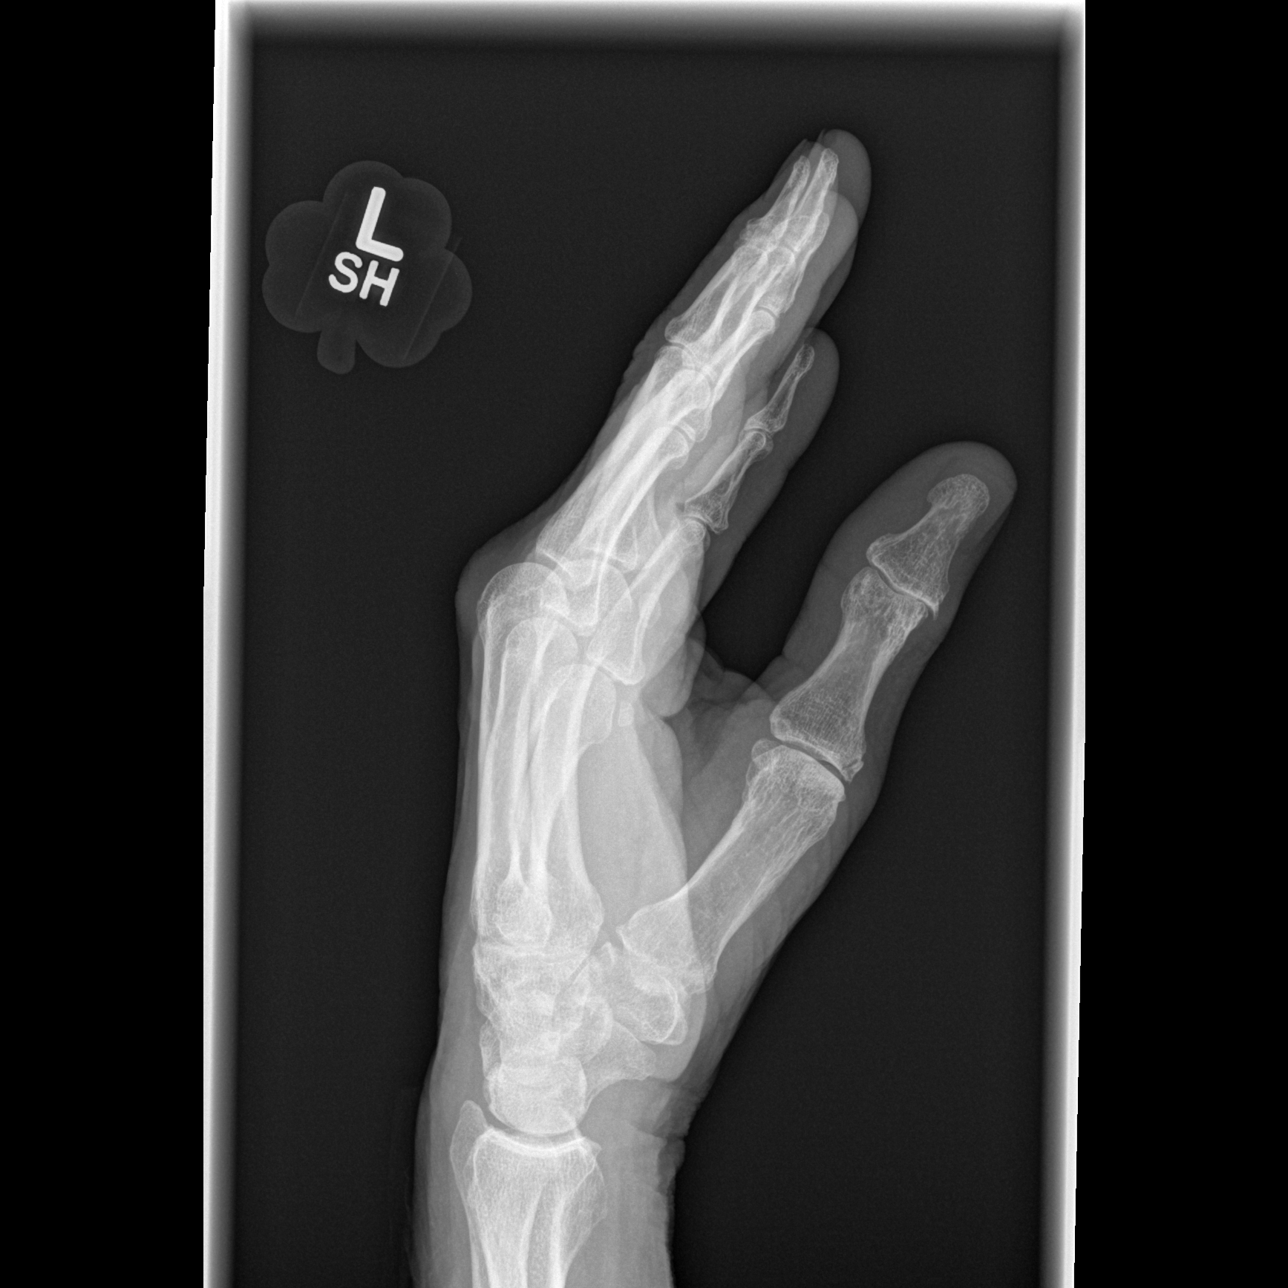

[3 of 3 positions shown; findings below may reference images not displayed]

FINDINGS: Small linear lucency through the radial aspect of the base of the
proximal phalanx of the thumb a visible only on the lateral view.
There is a suggestion of ossification of the margins of the lucency.
The bones appear osteopenic. Mild degenerative osteoarthritis
involving the DIP joints and thumb CMC joint. No focal soft tissue
abnormality.
IMPRESSION: 1. Possible nondisplaced fracture through the radial aspect of the
base of the proximal phalanx of the thumb. If there is no point
tenderness at this location, this may simply represent a prominent
vascular channel.
2. Degenerative osteoarthritis involving the thumb CMC joint and all
DIP joints.

## 2019-10-25 DEATH — deceased
